# Patient Record
Sex: Female | Born: 1945 | Race: White | Hispanic: No | Marital: Married
Health system: Southern US, Community
[De-identification: ages and names within clinical notes are randomized; demographics above are authoritative.]

## PROBLEM LIST (undated history)

## (undated) DIAGNOSIS — Z923 Personal history of irradiation: Secondary | ICD-10-CM

## (undated) HISTORY — PX: ABDOMINAL HYSTERECTOMY: SHX81

## (undated) HISTORY — PX: BREAST LUMPECTOMY: SHX2

---

## 2005-01-22 HISTORY — PX: BREAST LUMPECTOMY: SHX2

## 2005-01-22 HISTORY — PX: BREAST BIOPSY: SHX20

## 2020-07-26 ENCOUNTER — Inpatient Hospital Stay
Admission: RE | Admit: 2020-07-26 | Discharge: 2020-07-26 | Disposition: A | Discharge status: Home / Self Care | Payer: Self-pay | Source: Ambulatory Visit | Attending: *Deleted | Admitting: *Deleted

## 2020-07-26 ENCOUNTER — Other Ambulatory Visit: Payer: Self-pay | Admitting: *Deleted

## 2020-07-26 DIAGNOSIS — Z1231 Encounter for screening mammogram for malignant neoplasm of breast: Secondary | ICD-10-CM

## 2020-07-27 ENCOUNTER — Other Ambulatory Visit: Payer: Self-pay | Admitting: Family Medicine

## 2020-07-27 DIAGNOSIS — Z1231 Encounter for screening mammogram for malignant neoplasm of breast: Secondary | ICD-10-CM

## 2020-08-19 ENCOUNTER — Ambulatory Visit
Admission: RE | Admit: 2020-08-19 | Discharge: 2020-08-19 | Disposition: A | Discharge status: Home / Self Care | Payer: Medicare Other | Source: Ambulatory Visit | Attending: Family Medicine | Admitting: Family Medicine

## 2020-08-19 ENCOUNTER — Other Ambulatory Visit: Payer: Self-pay

## 2020-08-19 DIAGNOSIS — Z1231 Encounter for screening mammogram for malignant neoplasm of breast: Secondary | ICD-10-CM | POA: Diagnosis present

## 2020-08-19 HISTORY — DX: Personal history of irradiation: Z92.3

## 2020-08-25 ENCOUNTER — Other Ambulatory Visit: Payer: Self-pay | Admitting: Family Medicine

## 2020-08-25 DIAGNOSIS — N6489 Other specified disorders of breast: Secondary | ICD-10-CM

## 2020-08-25 DIAGNOSIS — R928 Other abnormal and inconclusive findings on diagnostic imaging of breast: Secondary | ICD-10-CM

## 2020-09-05 ENCOUNTER — Ambulatory Visit
Admission: RE | Admit: 2020-09-05 | Discharge: 2020-09-05 | Disposition: A | Discharge status: Home / Self Care | Payer: Medicare Other | Source: Ambulatory Visit | Attending: Family Medicine | Admitting: Family Medicine

## 2020-09-05 ENCOUNTER — Other Ambulatory Visit: Payer: Self-pay

## 2020-09-05 DIAGNOSIS — R928 Other abnormal and inconclusive findings on diagnostic imaging of breast: Secondary | ICD-10-CM

## 2020-09-05 DIAGNOSIS — N6489 Other specified disorders of breast: Secondary | ICD-10-CM | POA: Diagnosis present

## 2021-07-24 ENCOUNTER — Other Ambulatory Visit: Payer: Self-pay | Admitting: Family Medicine

## 2021-07-24 DIAGNOSIS — Z1231 Encounter for screening mammogram for malignant neoplasm of breast: Secondary | ICD-10-CM

## 2021-08-23 ENCOUNTER — Ambulatory Visit
Admission: RE | Admit: 2021-08-23 | Discharge: 2021-08-23 | Disposition: A | Discharge status: Home / Self Care | Payer: Medicare Other | Source: Ambulatory Visit | Attending: Family Medicine | Admitting: Family Medicine

## 2021-08-23 DIAGNOSIS — Z1231 Encounter for screening mammogram for malignant neoplasm of breast: Secondary | ICD-10-CM | POA: Insufficient documentation

## 2022-07-10 ENCOUNTER — Other Ambulatory Visit: Payer: Self-pay | Admitting: Family Medicine

## 2022-07-10 DIAGNOSIS — Z1231 Encounter for screening mammogram for malignant neoplasm of breast: Secondary | ICD-10-CM

## 2022-07-27 ENCOUNTER — Other Ambulatory Visit: Payer: Self-pay | Admitting: Internal Medicine

## 2022-07-27 DIAGNOSIS — Z853 Personal history of malignant neoplasm of breast: Secondary | ICD-10-CM

## 2022-07-27 DIAGNOSIS — R0609 Other forms of dyspnea: Secondary | ICD-10-CM

## 2022-07-27 DIAGNOSIS — R0602 Shortness of breath: Secondary | ICD-10-CM

## 2022-07-27 DIAGNOSIS — I10 Essential (primary) hypertension: Secondary | ICD-10-CM

## 2022-07-27 DIAGNOSIS — R079 Chest pain, unspecified: Secondary | ICD-10-CM

## 2022-07-27 DIAGNOSIS — E7849 Other hyperlipidemia: Secondary | ICD-10-CM

## 2022-07-27 DIAGNOSIS — E785 Hyperlipidemia, unspecified: Secondary | ICD-10-CM

## 2022-07-27 DIAGNOSIS — K219 Gastro-esophageal reflux disease without esophagitis: Secondary | ICD-10-CM

## 2022-07-27 DIAGNOSIS — R011 Cardiac murmur, unspecified: Secondary | ICD-10-CM

## 2022-07-30 ENCOUNTER — Other Ambulatory Visit: Payer: Self-pay | Admitting: Internal Medicine

## 2022-07-30 DIAGNOSIS — R079 Chest pain, unspecified: Secondary | ICD-10-CM

## 2022-07-30 DIAGNOSIS — I10 Essential (primary) hypertension: Secondary | ICD-10-CM

## 2022-07-30 DIAGNOSIS — Z853 Personal history of malignant neoplasm of breast: Secondary | ICD-10-CM

## 2022-07-30 DIAGNOSIS — E079 Disorder of thyroid, unspecified: Secondary | ICD-10-CM

## 2022-07-30 DIAGNOSIS — R0602 Shortness of breath: Secondary | ICD-10-CM

## 2022-07-30 DIAGNOSIS — R9431 Abnormal electrocardiogram [ECG] [EKG]: Secondary | ICD-10-CM

## 2022-07-30 DIAGNOSIS — K219 Gastro-esophageal reflux disease without esophagitis: Secondary | ICD-10-CM

## 2022-07-30 DIAGNOSIS — E785 Hyperlipidemia, unspecified: Secondary | ICD-10-CM

## 2022-07-30 DIAGNOSIS — M069 Rheumatoid arthritis, unspecified: Secondary | ICD-10-CM

## 2022-07-30 DIAGNOSIS — T66XXXA Radiation sickness, unspecified, initial encounter: Secondary | ICD-10-CM

## 2022-07-30 DIAGNOSIS — R0609 Other forms of dyspnea: Secondary | ICD-10-CM

## 2022-07-30 DIAGNOSIS — R011 Cardiac murmur, unspecified: Secondary | ICD-10-CM

## 2022-08-16 ENCOUNTER — Telehealth (HOSPITAL_COMMUNITY): Payer: Self-pay | Admitting: *Deleted

## 2022-08-16 ENCOUNTER — Encounter (HOSPITAL_COMMUNITY): Payer: Self-pay

## 2022-08-16 MED ORDER — METOPROLOL TARTRATE 100 MG PO TABS: ORAL_TABLET | ORAL | 0 refills | Status: AC

## 2022-08-16 NOTE — Addendum Note (Signed)
Addended by: Johney Frame A on: 08/16/2022 10:03 AM   Modules accepted: Orders

## 2022-08-16 NOTE — Telephone Encounter (Signed)
Reaching out to patient to offer assistance regarding upcoming cardiac imaging study; pt verbalizes understanding of appt date/time, parking situation and where to check in, pre-test NPO status and medications ordered, and verified current allergies; name and call back number provided for further questions should they arise Johney Frame RN Navigator Cardiac Imaging [facility] [phone] office [phone] cell   Patient to take 100 mg metoprolol tartrate 2 hours prior, hold lisinopril. Medication sent to preferred pharmacy.

## 2022-08-16 NOTE — Telephone Encounter (Signed)
Attempted to call patient regarding upcoming cardiac CT appointment. °Left message on voicemail with name and callback number ° °Merle Prescott RN Navigator Cardiac Imaging °[facility] °[phone] Office °[phone] Cell ° °

## 2022-08-20 ENCOUNTER — Ambulatory Visit
Admission: RE | Admit: 2022-08-20 | Discharge: 2022-08-20 | Disposition: A | Discharge status: Home / Self Care | Payer: Medicare Other | Source: Ambulatory Visit | Attending: Internal Medicine | Admitting: Internal Medicine

## 2022-08-20 DIAGNOSIS — R9431 Abnormal electrocardiogram [ECG] [EKG]: Secondary | ICD-10-CM | POA: Diagnosis present

## 2022-08-20 DIAGNOSIS — E079 Disorder of thyroid, unspecified: Secondary | ICD-10-CM | POA: Diagnosis present

## 2022-08-20 DIAGNOSIS — R079 Chest pain, unspecified: Secondary | ICD-10-CM | POA: Diagnosis present

## 2022-08-20 DIAGNOSIS — R0602 Shortness of breath: Secondary | ICD-10-CM | POA: Diagnosis present

## 2022-08-20 MED ORDER — IOHEXOL 350 MG/ML SOLN
80.0000 mL | Freq: Once | INTRAVENOUS | Status: AC | PRN
  Administered 2022-08-20: 80 mL via INTRAVENOUS

## 2022-08-20 MED ORDER — NITROGLYCERIN 0.4 MG SL SUBL
0.8000 mg | SUBLINGUAL_TABLET | Freq: Once | SUBLINGUAL | Status: AC
  Administered 2022-08-20: 0.8 mg via SUBLINGUAL

## 2022-08-20 NOTE — Progress Notes (Signed)
Patient tolerated procedure well. W/C to lobby.  Ambulate w/o difficulty. Denies light headedness or being dizzy. Encouraged to drink extra water today and reasoning explained. Verbalized understanding. All questions answered. ABC intact. No further needs. Discharge from procedure area w/o issues.

## 2022-08-27 ENCOUNTER — Ambulatory Visit
Admission: RE | Admit: 2022-08-27 | Discharge: 2022-08-27 | Disposition: A | Discharge status: Home / Self Care | Payer: Medicare Other | Source: Ambulatory Visit | Attending: Family Medicine | Admitting: Family Medicine

## 2022-08-27 DIAGNOSIS — Z1231 Encounter for screening mammogram for malignant neoplasm of breast: Secondary | ICD-10-CM | POA: Insufficient documentation

## 2023-01-14 ENCOUNTER — Other Ambulatory Visit: Payer: Self-pay | Admitting: Obstetrics and Gynecology

## 2023-01-14 DIAGNOSIS — R2231 Localized swelling, mass and lump, right upper limb: Secondary | ICD-10-CM

## 2023-01-18 ENCOUNTER — Other Ambulatory Visit: Payer: Self-pay | Admitting: Obstetrics and Gynecology

## 2023-01-18 ENCOUNTER — Ambulatory Visit
Admission: RE | Admit: 2023-01-18 | Discharge: 2023-01-18 | Disposition: A | Discharge status: Home / Self Care | Payer: Medicare Other | Source: Ambulatory Visit | Attending: Obstetrics and Gynecology | Admitting: Obstetrics and Gynecology

## 2023-01-18 DIAGNOSIS — R2231 Localized swelling, mass and lump, right upper limb: Secondary | ICD-10-CM | POA: Diagnosis present

## 2023-01-18 DIAGNOSIS — N63 Unspecified lump in unspecified breast: Secondary | ICD-10-CM

## 2023-01-18 DIAGNOSIS — R928 Other abnormal and inconclusive findings on diagnostic imaging of breast: Secondary | ICD-10-CM | POA: Insufficient documentation

## 2023-02-17 IMAGING — MG MM DIGITAL DIAGNOSTIC UNILAT*L* W/ TOMO W/ CAD
4 series · 4 of 12 positions shown · non-contrast
Comparison: Previous exam(s).

CLINICAL DATA: 74-year-old female recalled from screening mammogram
dated 08/19/2020 for a possible left breast asymmetry.

EXAM:
DIGITAL DIAGNOSTIC UNILATERAL LEFT MAMMOGRAM WITH TOMOSYNTHESIS AND
CAD
TECHNIQUE: Left digital diagnostic mammography and breast tomosynthesis was
performed. The images were evaluated with computer-aided detection.

[L ML synth-2D]
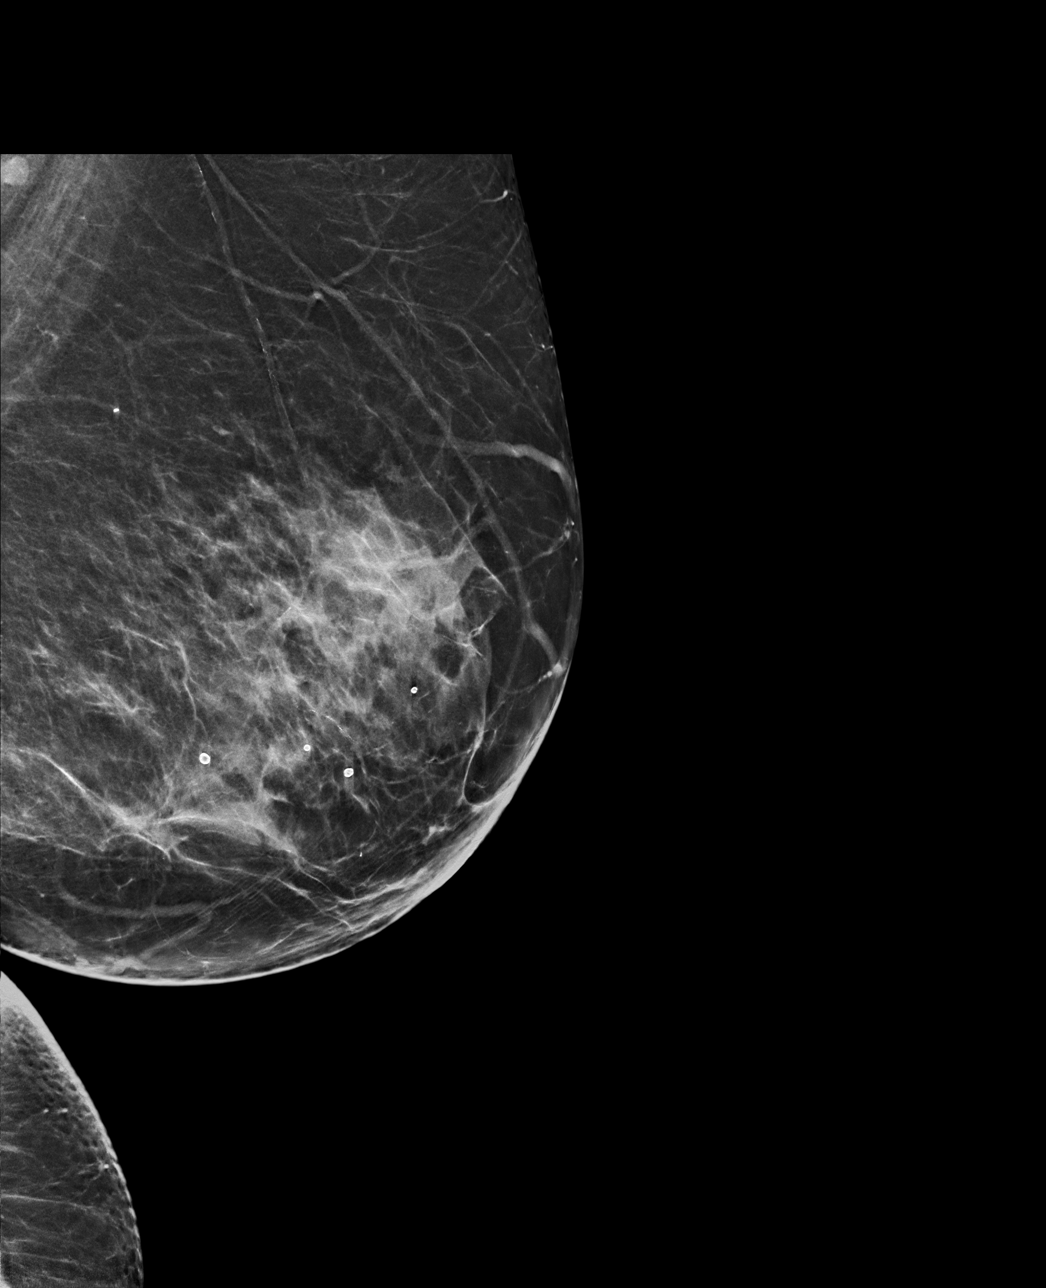

[L CC synth-2D]
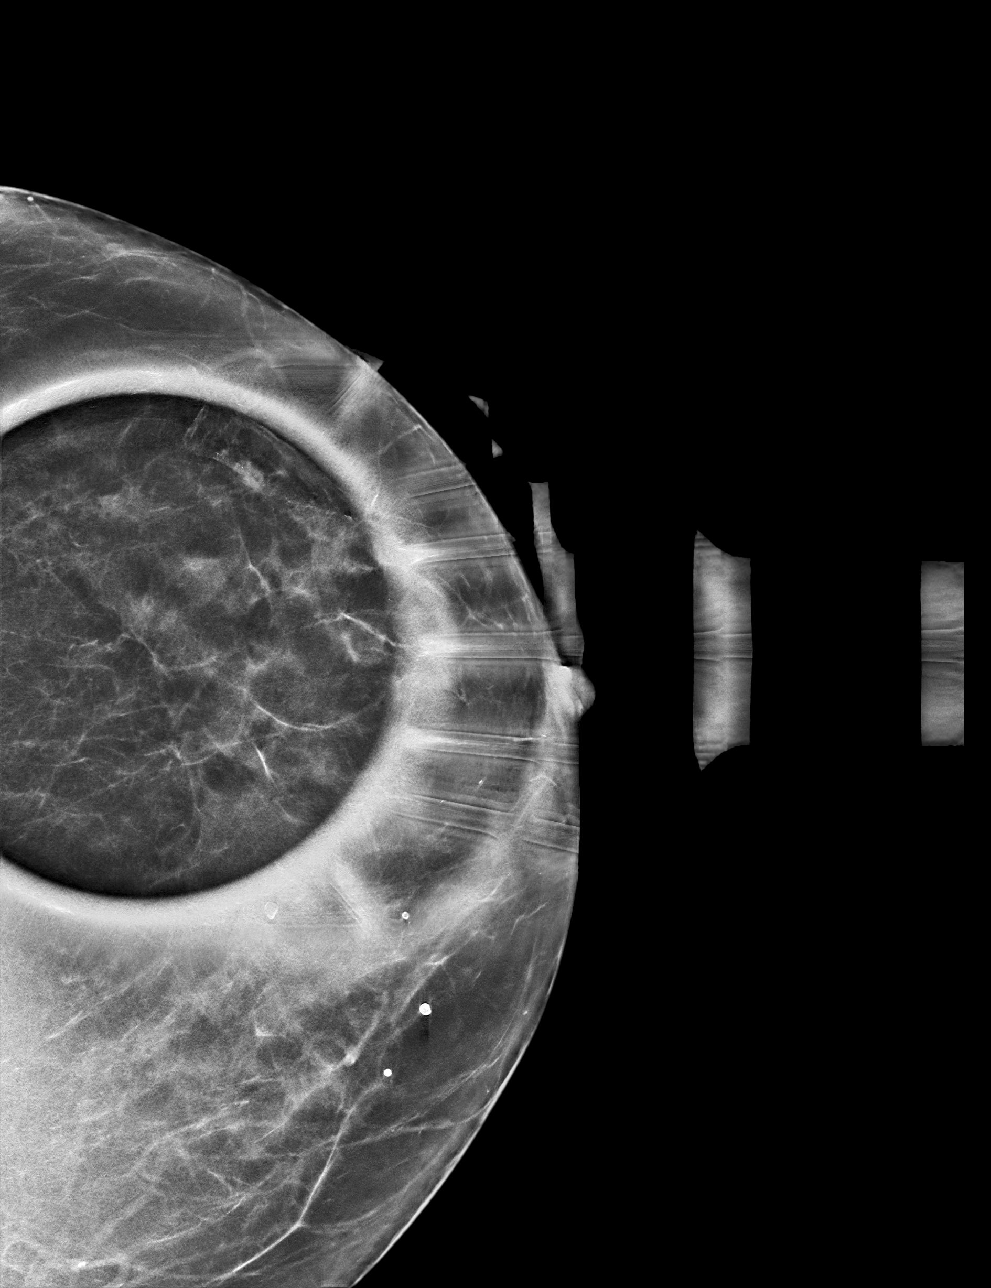

[L CC tomo · tomo slice 29/57.0]
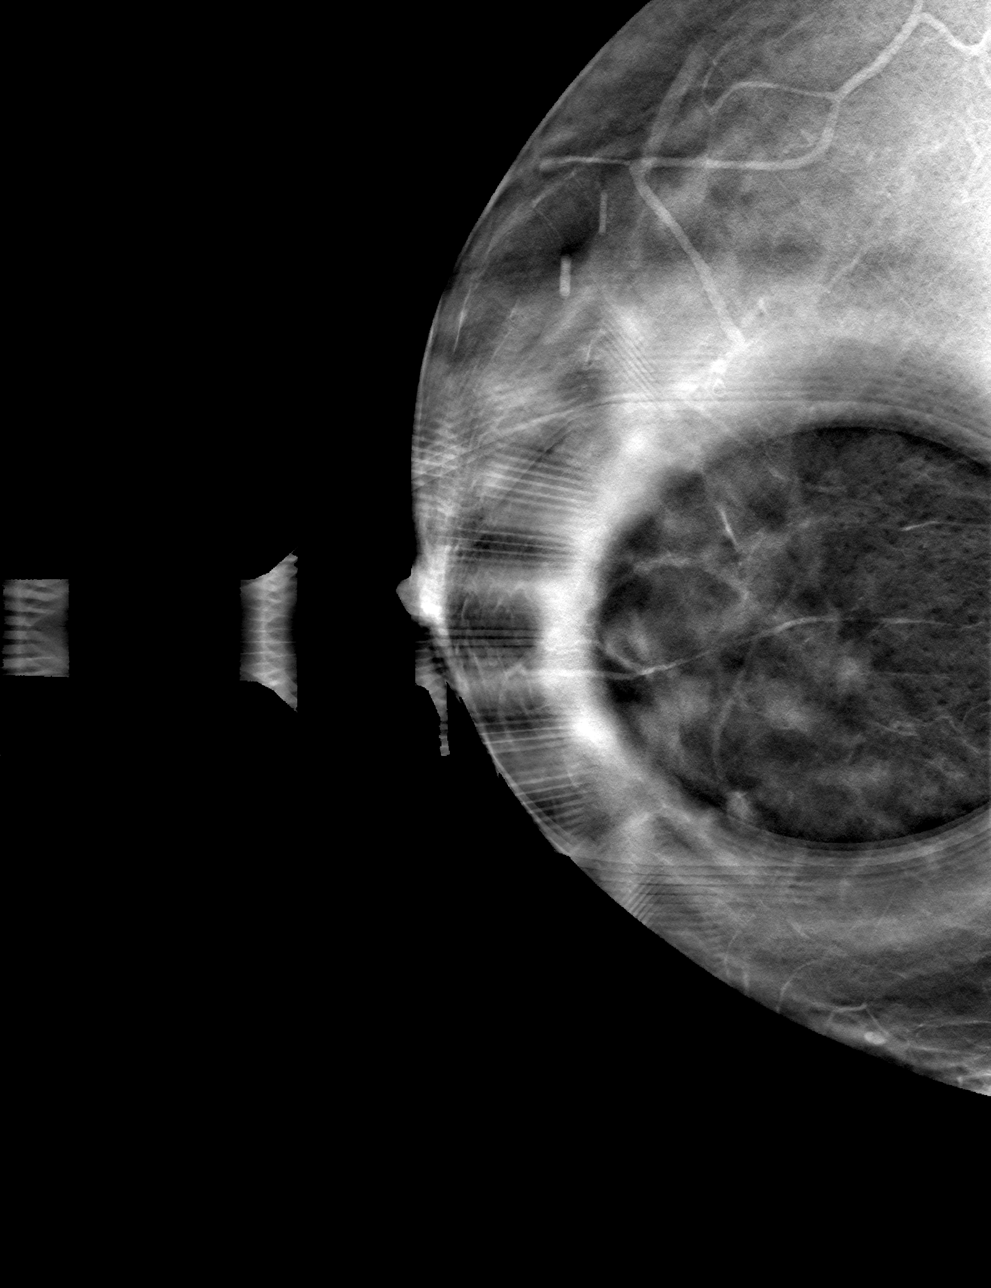

[L ML tomo · tomo slice 37/73.0]
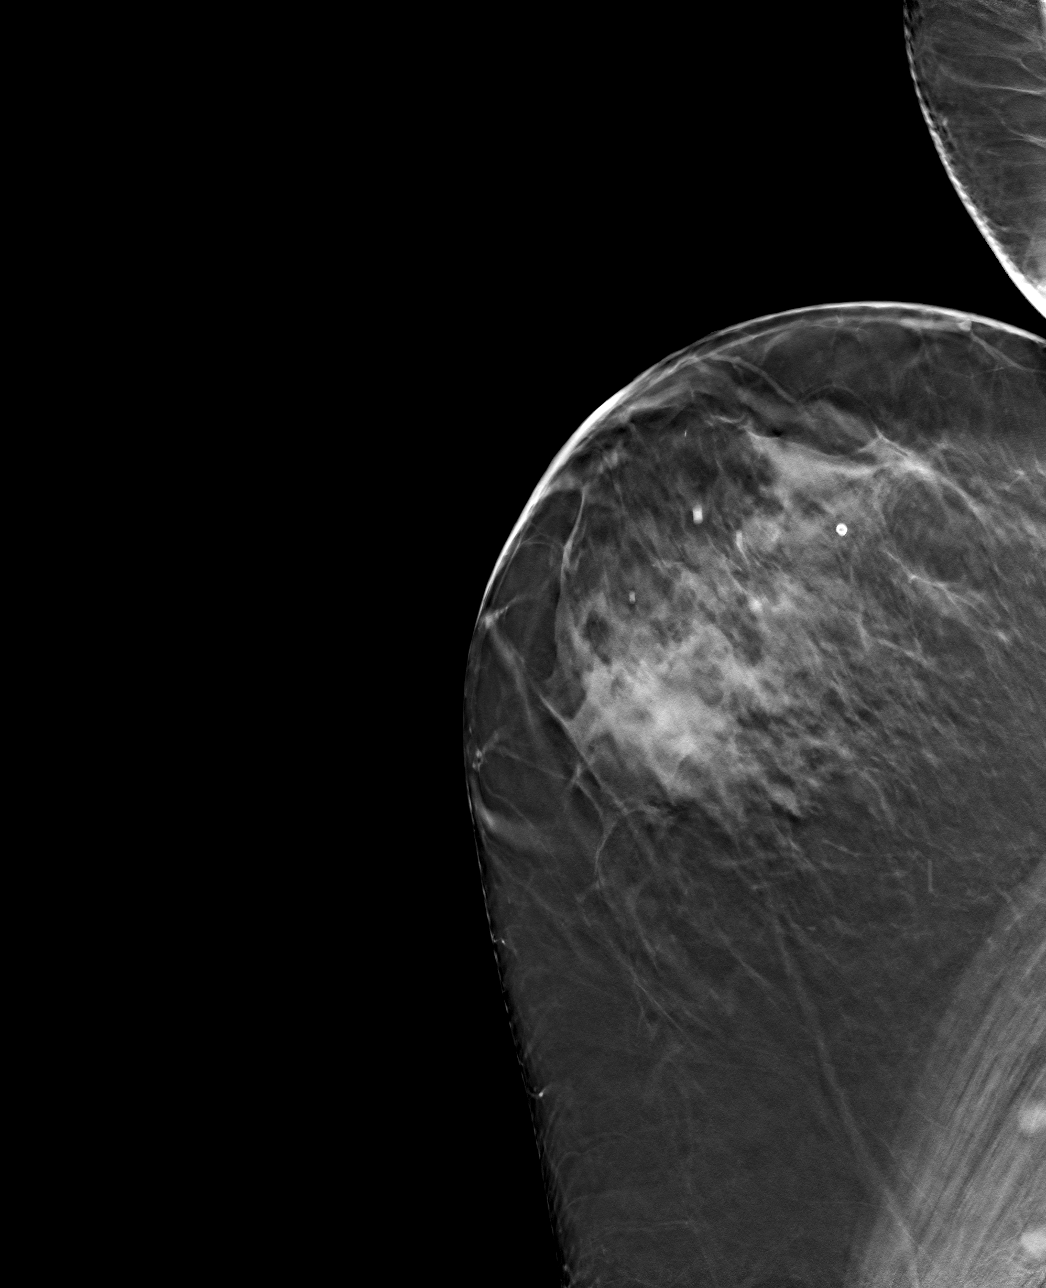

[4 of 12 positions shown; findings below may reference images not displayed]

ACR Breast Density Category c: The breast tissue is heterogeneously
dense, which may obscure small masses.
FINDINGS: Previously described, possible asymmetry in the central left breast
is seen on the cc projection only resolves into well dispersed
fibroglandular tissue on today's additional views. No suspicious
findings are identified.
IMPRESSION: No mammographic evidence of malignancy.

RECOMMENDATION:
Screening mammogram in one year.(Code:[id number])

I have discussed the findings and recommendations with the patient.
If applicable, a reminder letter will be sent to the patient
regarding the next appointment.

BI-RADS CATEGORY  1: Negative.

## 2023-02-27 ENCOUNTER — Other Ambulatory Visit: Payer: Self-pay | Admitting: Obstetrics

## 2023-02-27 DIAGNOSIS — R2231 Localized swelling, mass and lump, right upper limb: Secondary | ICD-10-CM

## 2023-07-15 ENCOUNTER — Other Ambulatory Visit: Payer: Self-pay | Admitting: Family Medicine

## 2023-07-15 DIAGNOSIS — Z1231 Encounter for screening mammogram for malignant neoplasm of breast: Secondary | ICD-10-CM

## 2023-08-28 ENCOUNTER — Ambulatory Visit
Admission: RE | Admit: 2023-08-28 | Discharge: 2023-08-28 | Disposition: A | Discharge status: Home / Self Care | Source: Ambulatory Visit | Attending: Family Medicine | Admitting: Family Medicine

## 2023-08-28 DIAGNOSIS — Z1231 Encounter for screening mammogram for malignant neoplasm of breast: Secondary | ICD-10-CM | POA: Insufficient documentation
